# Patient Record
Sex: Female | Born: 1975 | Race: Black or African American | Hispanic: No | Marital: Married | Smoking: Never smoker
Health system: Southern US, Community
[De-identification: ages and names within clinical notes are randomized; demographics above are authoritative.]

---

## 2006-08-06 ENCOUNTER — Ambulatory Visit: Payer: Self-pay | Admitting: Emergency Medicine

## 2006-10-07 ENCOUNTER — Ambulatory Visit: Payer: Self-pay | Admitting: Internal Medicine

## 2011-03-12 HISTORY — PX: MYOMECTOMY: SHX85

## 2011-10-08 DIAGNOSIS — D219 Benign neoplasm of connective and other soft tissue, unspecified: Secondary | ICD-10-CM | POA: Insufficient documentation

## 2014-03-24 DIAGNOSIS — IMO0002 Reserved for concepts with insufficient information to code with codable children: Secondary | ICD-10-CM | POA: Insufficient documentation

## 2014-03-24 DIAGNOSIS — O09529 Supervision of elderly multigravida, unspecified trimester: Secondary | ICD-10-CM | POA: Insufficient documentation

## 2014-03-24 DIAGNOSIS — O30049 Twin pregnancy, dichorionic/diamniotic, unspecified trimester: Secondary | ICD-10-CM | POA: Insufficient documentation

## 2014-03-30 DIAGNOSIS — O280 Abnormal hematological finding on antenatal screening of mother: Secondary | ICD-10-CM | POA: Insufficient documentation

## 2014-09-19 DIAGNOSIS — Z98891 History of uterine scar from previous surgery: Secondary | ICD-10-CM | POA: Insufficient documentation

## 2014-09-22 DIAGNOSIS — Z3A39 39 weeks gestation of pregnancy: Secondary | ICD-10-CM | POA: Insufficient documentation

## 2015-11-08 ENCOUNTER — Encounter: Payer: Self-pay | Admitting: Emergency Medicine

## 2015-11-08 ENCOUNTER — Emergency Department: Payer: BC Managed Care – PPO

## 2015-11-08 ENCOUNTER — Inpatient Hospital Stay
Admission: EM | Admit: 2015-11-08 | Discharge: 2015-11-15 | DRG: 201 | Disposition: A | Discharge status: Home / Self Care | Payer: BC Managed Care – PPO | Attending: Surgery | Admitting: Surgery

## 2015-11-08 DIAGNOSIS — J9383 Other pneumothorax: Secondary | ICD-10-CM | POA: Diagnosis not present

## 2015-11-08 DIAGNOSIS — J939 Pneumothorax, unspecified: Secondary | ICD-10-CM

## 2015-11-08 DIAGNOSIS — Z09 Encounter for follow-up examination after completed treatment for conditions other than malignant neoplasm: Secondary | ICD-10-CM

## 2015-11-08 DIAGNOSIS — R079 Chest pain, unspecified: Secondary | ICD-10-CM

## 2015-11-08 DIAGNOSIS — J9382 Other air leak: Secondary | ICD-10-CM | POA: Diagnosis present

## 2015-11-08 DIAGNOSIS — Z9689 Presence of other specified functional implants: Secondary | ICD-10-CM

## 2015-11-08 LAB — CBC WITH DIFFERENTIAL/PLATELET
BASOS ABS: 0.1 10*3/uL (ref 0–0.1)
BASOS PCT: 1 %
EOS ABS: 0.2 10*3/uL (ref 0–0.7)
Eosinophils Relative: 3 %
HEMATOCRIT: 42 % (ref 35.0–47.0)
HEMOGLOBIN: 14.1 g/dL (ref 12.0–16.0)
Lymphocytes Relative: 49 %
Lymphs Abs: 2.9 10*3/uL (ref 1.0–3.6)
MCH: 28.9 pg (ref 26.0–34.0)
MCHC: 33.5 g/dL (ref 32.0–36.0)
MCV: 86.1 fL (ref 80.0–100.0)
Monocytes Absolute: 0.4 10*3/uL (ref 0.2–0.9)
Monocytes Relative: 7 %
NEUTROS ABS: 2.3 10*3/uL (ref 1.4–6.5)
NEUTROS PCT: 40 %
Platelets: 198 10*3/uL (ref 150–440)
RBC: 4.88 MIL/uL (ref 3.80–5.20)
RDW: 13.1 % (ref 11.5–14.5)
WBC: 5.9 10*3/uL (ref 3.6–11.0)

## 2015-11-08 LAB — BASIC METABOLIC PANEL
ANION GAP: 7 (ref 5–15)
BUN: 19 mg/dL (ref 6–20)
CALCIUM: 9.5 mg/dL (ref 8.9–10.3)
CHLORIDE: 102 mmol/L (ref 101–111)
CO2: 28 mmol/L (ref 22–32)
CREATININE: 0.75 mg/dL (ref 0.44–1.00)
GFR calc non Af Amer: >60 mL/min (ref >60)
Glucose, Bld: 83 mg/dL (ref 65–99)
Potassium: 4 mmol/L (ref 3.5–5.1)
SODIUM: 137 mmol/L (ref 135–145)

## 2015-11-08 MED ORDER — HEPARIN SODIUM (PORCINE) 5000 UNIT/ML IJ SOLN
5000.0000 [IU] | Freq: Three times a day (TID) | INTRAMUSCULAR | Status: DC
  Administered 2015-11-08 – 2015-11-15 (×20): 5000 [IU] via SUBCUTANEOUS
  Filled 2015-11-08 (×19): qty 1

## 2015-11-08 MED ORDER — SODIUM CHLORIDE 0.9% FLUSH: 3.0000 mL | INTRAVENOUS | Status: DC | PRN

## 2015-11-08 MED ORDER — MORPHINE SULFATE (PF) 2 MG/ML IV SOLN
2.0000 mg | INTRAVENOUS | Status: DC | PRN
  Administered 2015-11-08 – 2015-11-14 (×9): 2 mg via INTRAVENOUS
  Filled 2015-11-08 (×9): qty 1

## 2015-11-08 MED ORDER — LIDOCAINE-EPINEPHRINE (PF) 1 %-1:200000 IJ SOLN
30.0000 mL | Freq: Once | INTRAMUSCULAR | Status: AC
  Administered 2015-11-08: 30 mL via INTRADERMAL
  Filled 2015-11-08: qty 30

## 2015-11-08 MED ORDER — MORPHINE SULFATE (PF) 2 MG/ML IV SOLN
INTRAVENOUS | Status: AC
  Administered 2015-11-08: 2 mg via INTRAVENOUS
  Filled 2015-11-08: qty 1

## 2015-11-08 MED ORDER — ONDANSETRON HCL 4 MG PO TABS
4.0000 mg | ORAL_TABLET | Freq: Four times a day (QID) | ORAL | Status: DC | PRN
Start: 2015-11-08 — End: 2015-11-15
  Administered 2015-11-09: 4 mg via ORAL
  Filled 2015-11-08: qty 1

## 2015-11-08 MED ORDER — MORPHINE SULFATE (PF) 2 MG/ML IV SOLN
2.0000 mg | Freq: Once | INTRAVENOUS | Status: AC
  Administered 2015-11-08: 2 mg via INTRAVENOUS

## 2015-11-08 MED ORDER — ONDANSETRON HCL 4 MG/2ML IJ SOLN
4.0000 mg | Freq: Four times a day (QID) | INTRAMUSCULAR | Status: DC | PRN
Start: 2015-11-08 — End: 2015-11-15
  Administered 2015-11-08 – 2015-11-09 (×3): 4 mg via INTRAVENOUS
  Filled 2015-11-08 (×4): qty 2

## 2015-11-08 MED ORDER — LIDOCAINE HCL (PF) 1 % IJ SOLN
INTRAMUSCULAR | Status: AC
  Filled 2015-11-08: qty 25

## 2015-11-08 MED ORDER — LIDOCAINE-EPINEPHRINE (PF) 1 %-1:200000 IJ SOLN
30.0000 mL | Freq: Once | INTRAMUSCULAR | Status: AC
  Administered 2015-11-08: 30 mL via INTRADERMAL

## 2015-11-08 MED ORDER — LIDOCAINE-EPINEPHRINE (PF) 1 %-1:200000 IJ SOLN
INTRAMUSCULAR | Status: AC
  Administered 2015-11-08: 30 mL via INTRADERMAL
  Filled 2015-11-08: qty 30

## 2015-11-08 MED ORDER — SODIUM CHLORIDE 0.9% FLUSH
3.0000 mL | Freq: Two times a day (BID) | INTRAVENOUS | Status: DC
  Administered 2015-11-08 – 2015-11-15 (×14): 3 mL via INTRAVENOUS

## 2015-11-08 MED ORDER — LIDOCAINE HCL (PF) 1 % IJ SOLN
30.0000 mL | Freq: Once | INTRAMUSCULAR | Status: DC
  Filled 2015-11-08: qty 30

## 2015-11-08 MED ORDER — MORPHINE SULFATE (PF) 2 MG/ML IV SOLN
2.0000 mg | Freq: Once | INTRAVENOUS | Status: AC
Start: 2015-11-08 — End: 2015-11-08
  Administered 2015-11-08: 2 mg via INTRAVENOUS
  Filled 2015-11-08: qty 1

## 2015-11-08 MED ORDER — HYDROCODONE-ACETAMINOPHEN 5-325 MG PO TABS
1.0000 | ORAL_TABLET | ORAL | Status: DC | PRN
  Administered 2015-11-09 (×5): 2 via ORAL
  Administered 2015-11-10 (×2): 1 via ORAL
  Administered 2015-11-11: 2 via ORAL
  Administered 2015-11-11: 1 via ORAL
  Administered 2015-11-12 (×2): 2 via ORAL
  Administered 2015-11-12: 1 via ORAL
  Administered 2015-11-12 – 2015-11-14 (×5): 2 via ORAL
  Administered 2015-11-14: 1 via ORAL
  Administered 2015-11-14 – 2015-11-15 (×5): 2 via ORAL
  Filled 2015-11-08: qty 1
  Filled 2015-11-08 (×5): qty 2
  Filled 2015-11-08: qty 1
  Filled 2015-11-08: qty 2
  Filled 2015-11-08: qty 1
  Filled 2015-11-08 (×3): qty 2
  Filled 2015-11-08: qty 1
  Filled 2015-11-08 (×7): qty 2
  Filled 2015-11-08 (×2): qty 1
  Filled 2015-11-08 (×3): qty 2

## 2015-11-08 MED ORDER — SODIUM CHLORIDE 0.9 % IV SOLN: 250.0000 mL | INTRAVENOUS | Status: DC | PRN

## 2015-11-08 NOTE — Op Note (Signed)
   8:53 PM  PATIENT:  Laurie Riggs  40 y.o. female  PRE-OPERATIVE DIAGNOSIS:  Right near-complete pneumothorax  POST-OPERATIVE DIAGNOSIS:  Same  PROCEDURE: Right chest tube placement  SURGEON:  Florene Glen MD, FACS   ANESTHESIA:   local   Details of Procedure: Patient with a near complete right-sided pneumothorax requiring urgent reexpansion via chest tube. Preoperatively discussed rationale for surgery the options of observation risk bleeding infection failure to resolve her Coumadin near-complete pneumothorax and the need for further surgery she and her family understood and agreed to proceed.  Patient was identified and a surgical pause was performed she was placed in the left lateral position prepped and draped in a sterile fashion local anesthetic was infiltrated into the skin and subcutaneous tissues tissues for a total of 60 cc of 1% lidocaine with epinephrine. On the anterior axillary line at approximately the fifth interspace a hypodermic needle was placed into the chest cavity and air was expelled signifying the presence of a right pneumothorax. An incision was made and blunt dissection using a clamp down to the rib was performed and then the 20 French trocar tip catheter was placed into the chest cavity without difficulty immediate expulsion of air was noted on coughing. The chest tube was tied in with multiple 0 silk sutures and connected to Pleur-evac with -20 cm of suction.  A portable chest film has been ordered. Patient tolerated the procedure well she was left in stable condition in the emergency room to be admitted to the hospital.   Florene Glen, MD FACS

## 2015-11-08 NOTE — ED Provider Notes (Signed)
[facility] Emergency Department Provider Note   ____________________________________________   I have reviewed the triage vital signs and the nursing notes.   HISTORY  Chief Complaint Shortness of breath, chest pain   History limited by: Not Limited   HPI Laurie Riggs is a 40 y.o. female who presents to the emergency department today from [facility] because of x-ray concerning for pneumothorax. Patient states that she had an episode of chest pain late last week. It was located on the right side.It occurred while the patient was getting ready to leave her house. She has not had any trauma to her chest wall. The patient then started developing shortness of breath two days ago. It has been getting worse and has been constant. She denies similar symptoms in the past. Went to [facility] where an x-ray was performed and showed a large right sided pneumothorax.    History reviewed. No pertinent past medical history.  There are no active problems to display for this patient.   History reviewed. No pertinent surgical history.  Prior to Admission medications   Not on File    Allergies Review of patient's allergies indicates not on file.  No family history on file.  Social History Social History  Substance Use Topics  . Smoking status: Never Smoker  . Smokeless tobacco: Never Used  . Alcohol use Yes     Comment: Social drinker. Denies daily use.    Review of Systems  Constitutional: Negative for fever. Cardiovascular: Positive for chest pain. Respiratory: Positive for shortness of breath. Gastrointestinal: Negative for abdominal pain, vomiting and diarrhea. Neurological: Negative for headaches, focal weakness or numbness.  10-point ROS otherwise negative.  ____________________________________________   PHYSICAL EXAM:  VITAL SIGNS: ED Triage Vitals  Enc Vitals Group     BP 125/83     Pulse 89     Resp 18     Temp 97.9      Temp src      SpO2 97%   Constitutional: Alert and oriented. Well appearing and in no distress. Eyes: Conjunctivae are normal. Normal extraocular movements. ENT   Head: Normocephalic and atraumatic.   Nose: No congestion/rhinnorhea.   Mouth/Throat: Mucous membranes are moist.   Neck: No stridor. Hematological/Lymphatic/Immunilogical: No cervical lymphadenopathy. Cardiovascular: Normal rate, regular rhythm.  No murmurs, rubs, or gallops. Respiratory: Normal respiratory effort without tachypnea nor retractions. Absent breath sounds on the right side.  Gastrointestinal: Soft and nontender. No distention.  Genitourinary: Deferred Musculoskeletal: Normal range of motion in all extremities. No lower extremity edema. Neurologic:  Normal speech and language. No gross focal neurologic deficits are appreciated.  Skin:  Skin is warm, dry and intact. No rash noted. Psychiatric: Mood and affect are normal. Speech and behavior are normal. Patient exhibits appropriate insight and judgment.  ____________________________________________    LABS (pertinent positives/negatives)  Labs Reviewed  CBC WITH DIFFERENTIAL/PLATELET  BASIC METABOLIC PANEL     ____________________________________________   EKG  None  ____________________________________________    RADIOLOGY  CXR IMPRESSION:  Very large right pneumothorax. Early tension pneumothorax is  suspected.   I, [name], personally discussed these images and results by phone with the on-call radiologist and used this discussion as part of my medical decision making.    ___________________________________________   PROCEDURES  Procedures  ____________________________________________   INITIAL IMPRESSION / ASSESSMENT AND PLAN / ED COURSE  Pertinent labs & imaging results that were available during my care of the patient were reviewed by me and considered in  my medical decision making (see chart for  details).  Patient presented from [facility] clinic because of SOB and CP and found to have large right sided pneumothorax. X-ray and exam consistent with pneumothorax. Surgery was consulted and will place chest tube and admit.  ____________________________________________   FINAL CLINICAL IMPRESSION(S) / ED DIAGNOSES  Final diagnoses:  Pneumothorax     Note: This dictation was prepared with Dragon dictation. Any transcriptional errors that result from this process are unintentional    [name], MD 11/08/15 [phone]

## 2015-11-08 NOTE — ED Notes (Signed)
Dr. Burt Knack at bedside preparing for right chest tube placement.

## 2015-11-08 NOTE — ED Triage Notes (Signed)
Patient to ED via POV with c/o SOB and CP that started last Monday. Patient was seen at [facility] and dx with a pneumothorax. Patient denies any pain at this time but does c/o SOB, worse with movement. Patient Is speaking in full sentences. Patient A&O x4.

## 2015-11-08 NOTE — Progress Notes (Signed)
Personal review of chest x-ray following chest tube placement demonstrates fairly good inflation with a small apical pneumothorax. Patient will remain on suction overnight and repeat chest x-ray in the morning.

## 2015-11-08 NOTE — H&P (Signed)
Laurie Riggs is an 40 y.o. female.    Chief Complaint: Right chest pain  HPI: This a patient with a right pneumothorax near complete. She noticed right-sided chest pain with the acute onset on Friday of last week she then started having assuring of shortness of breath on Monday which has worsened and she was seen in the emergency room where the diagnosis of a near complete right-sided pneumothorax was identified. She continues to have shortness of breath but is in no acute distress has asked see the patient for a chest tube. She has no past medical history Past surgical history was that of fibroid ectomy as well as C-section Social history she works in Air traffic controller at [employer] does not smoke or drink and has a 64-monthold baby  History reviewed. No pertinent past medical history.  History reviewed. No pertinent surgical history.  No family history on file. Social History:  reports that she has never smoked. She has never used smokeless tobacco. She reports that she drinks alcohol. Her drug history is not on file.  Allergies: No Known Allergies   (Not in a hospital admission)   Review of Systems  Constitutional: Negative.   HENT: Negative.   Eyes: Negative.   Respiratory: Positive for shortness of breath. Negative for cough, hemoptysis, sputum production and wheezing.   Cardiovascular: Positive for chest pain. Negative for palpitations, orthopnea, claudication and leg swelling.  Gastrointestinal: Negative.   Genitourinary: Negative.   Musculoskeletal: Negative.   Skin: Negative.   Neurological: Negative.   Endo/Heme/Allergies: Negative.   Psychiatric/Behavioral: Negative.      Physical Exam:  BP (!) 149/88   Pulse 99   Temp 97.9 F (36.6 C) (Oral)   Resp 19   Ht 5' 6"  (1.676 m)   Wt 200 lb (90.7 kg)   LMP 10/31/2015 (Exact Date)   SpO2 97%   BMI 32.28 kg/m   Physical Exam  Constitutional: She is oriented to person, place, and time and  well-developed, well-nourished, and in no distress. No distress.  HENT:  Head: Normocephalic and atraumatic.  Eyes: Right eye exhibits no discharge. Left eye exhibits no discharge. No scleral icterus.  Neck: Normal range of motion.  Cardiovascular: Normal rate, regular rhythm and normal heart sounds.   Pulmonary/Chest: Effort normal. No respiratory distress. She has no wheezes. She has no rales.  Absent breath sounds on the right normal breath sounds on the left, poor right-sided chest wall movement  Abdominal: Soft. She exhibits no distension. There is no tenderness.  Musculoskeletal: Normal range of motion. She exhibits no edema or tenderness.  Lymphadenopathy:    She has no cervical adenopathy.  Neurological: She is alert and oriented to person, place, and time.  Skin: Skin is warm and dry. She is not diaphoretic.  Psychiatric: Mood and affect normal.  Vitals reviewed.       Results for orders placed or performed during the hospital encounter of 11/08/15 (from the past 48 hour(s))  CBC with Differential     Status: None   Collection Time: 11/08/15  7:21 PM  Result Value Ref Range   WBC 5.9 3.6 - 11.0 K/uL   RBC 4.88 3.80 - 5.20 MIL/uL   Hemoglobin 14.1 12.0 - 16.0 g/dL   HCT 42.0 35.0 - 47.0 %   MCV 86.1 80.0 - 100.0 fL   MCH 28.9 26.0 - 34.0 pg   MCHC 33.5 32.0 - 36.0 g/dL   RDW 13.1 11.5 - 14.5 %   Platelets  198 150 - 440 K/uL   Neutrophils Relative % 40 %   Neutro Abs 2.3 1.4 - 6.5 K/uL   Lymphocytes Relative 49 %   Lymphs Abs 2.9 1.0 - 3.6 K/uL   Monocytes Relative 7 %   Monocytes Absolute 0.4 0.2 - 0.9 K/uL   Eosinophils Relative 3 %   Eosinophils Absolute 0.2 0 - 0.7 K/uL   Basophils Relative 1 %   Basophils Absolute 0.1 0 - 0.1 K/uL  Basic metabolic panel     Status: None   Collection Time: 11/08/15  7:21 PM  Result Value Ref Range   Sodium 137 135 - 145 mmol/L   Potassium 4.0 3.5 - 5.1 mmol/L   Chloride 102 101 - 111 mmol/L   CO2 28 22 - 32 mmol/L    Glucose, Bld 83 65 - 99 mg/dL   BUN 19 6 - 20 mg/dL   Creatinine, Ser 0.75 0.44 - 1.00 mg/dL   Calcium 9.5 8.9 - 10.3 mg/dL   GFR calc non Af Amer >60 >60 mL/min   GFR calc Af Amer >60 >60 mL/min    Comment: (NOTE) The eGFR has been calculated using the CKD EPI equation. This calculation has not been validated in all clinical situations. eGFR's persistently <60 mL/min signify possible Chronic Kidney Disease.    Anion gap 7 5 - 15   Dg Chest 1 View  Result Date: 11/08/2015 CLINICAL DATA:  Short of breath and chest pain EXAM: CHEST 1 VIEW COMPARISON:  None. FINDINGS: Large right pneumothorax is present. Estimated percentage is 90%. Heart is normal in size. There is slight shift of the mediastinum to the left compatible with early tension pneumothorax. Vascular congestion within the left lung is noted. IMPRESSION: Very large right pneumothorax. Early tension pneumothorax is suspected. Critical Value/emergent results were called by telephone at the time of interpretation on 11/08/2015 at 7:09 pm to Dr. [name] , who verbally acknowledged these results. Electronically Signed   By: Marybelle Killings M.D.   On: 11/08/2015 19:09     Assessment/Plan  This patient with right-sided complete pneumothorax spontaneous in nature happened 5 days ago.  She requires a chest tube placement urgently and this was discussed with she and her family as well as her emergency room physician and nursing staff.  I discussed with her the rationale for surgery the options of observation risk bleeding infection recurrence inability to completely reexpand the lung either due to a persistent air leak or due to the fact that this is been down for 5 days. This may require surgery in the form of thoracoscopy or open procedure. I also discussed the Dr. Faith Rogue would be able to see the patient in the next day or 2 for thoracic surgery consultation.  She and her family understood and agreed with this plan. See separate  dictation for chest tube placement she will be admitted to the hospital.  Florene Glen, MD, FACS

## 2015-11-08 NOTE — ED Notes (Signed)
Right tube inserted by Dr. Burt Knack. Patient tolerated well.

## 2015-11-09 ENCOUNTER — Observation Stay: Payer: BC Managed Care – PPO

## 2015-11-09 DIAGNOSIS — J9383 Other pneumothorax: Secondary | ICD-10-CM | POA: Diagnosis present

## 2015-11-09 DIAGNOSIS — J939 Pneumothorax, unspecified: Secondary | ICD-10-CM | POA: Diagnosis present

## 2015-11-09 DIAGNOSIS — J9382 Other air leak: Secondary | ICD-10-CM | POA: Diagnosis present

## 2015-11-09 DIAGNOSIS — J9311 Primary spontaneous pneumothorax: Secondary | ICD-10-CM | POA: Diagnosis not present

## 2015-11-09 NOTE — Progress Notes (Signed)
[name] Post-Op Note  Patient ID: Laurie Riggs, female   DOB: [birth year], 40 y.o.   MRN: [id number]  HISTORY: This patient is a 40 year old African-American female who was in her usual state of health until for 5 days prior to admission. At that time she experienced the acute onset of sharp right-sided chest pain followed by some shortness of breath a couple of hours later. The pain got somewhat better over the ensuing days but her shortness of breath got worse and she ultimately presented here to our emergency department. A chest x-ray was obtained in the emergency department and a subsequent chest tube was placed with prompt reexpansion of the lung. The patient states that she felt much better after the tube was placed and immediately felt relief of her shortness of breath. She currently is resting comfortably in bed. There is no family history of a pneumothorax. The patient has denied any prior lung disease or any prior pneumothorax. She was not experiencing any pulmonary symptoms prior to this episode.   Vitals:   11/08/15 2319 11/09/15 0521  BP: 126/78 122/64  Pulse: 98 80  Resp: 18   Temp: 97.6 F (36.4 C) 98.2 F (36.8 C)     EXAM:  Resp: Lungs are clear bilaterally.  No respiratory distress, normal effort.  There is a small air leak present. Heart:  Regular without murmurs.   Abd:  Abdomen is soft, non distended and non tender. No masses are palpable.  There is no rebound and no guarding.  Neurological: Alert and oriented to person, place, and time. Coordination normal.  Skin: Skin is warm and dry. No rash noted. No diaphoretic. No erythema. No pallor.  Psychiatric: Normal mood and affect. Normal behavior. Judgment and thought content normal.    ASSESSMENT: I have independently reviewed the patient's chest x-rays. There has been prompt reexpansion of the lung. The most recent chest x-ray from today shows the lung to be completely inflated.   PLAN:   We will  leave the patient on suction today. We will reassess tomorrow with another chest x-ray. I spent a long time reviewing with her the indications and risks of the various management options for a first-time spontaneous pneumothorax.    Nestor Lewandowsky, MD

## 2015-11-10 MED ORDER — SENNOSIDES-DOCUSATE SODIUM 8.6-50 MG PO TABS
1.0000 | ORAL_TABLET | Freq: Two times a day (BID) | ORAL | Status: DC
  Administered 2015-11-10 – 2015-11-15 (×11): 1 via ORAL
  Filled 2015-11-10 (×11): qty 1

## 2015-11-10 NOTE — Progress Notes (Signed)
Patient is alert and oriented x 4, ambulatory around unit, c/o moderate pain improved with hydrocodone, reports constipation, started on senekot, on room air, denies shortness of breath, minimal output from chest tube, chest xray ordered for the am, vital signs stable.

## 2015-11-10 NOTE — Progress Notes (Signed)
Patient may ambulate with chest tube to water seal per Dr. Genevive Bi, stool softer for constipation, telephone order.

## 2015-11-11 ENCOUNTER — Inpatient Hospital Stay: Payer: BC Managed Care – PPO

## 2015-11-11 DIAGNOSIS — J9311 Primary spontaneous pneumothorax: Secondary | ICD-10-CM

## 2015-11-11 LAB — CBC
HCT: 39.9 % (ref 35.0–47.0)
Hemoglobin: 13.4 g/dL (ref 12.0–16.0)
MCH: 29 pg (ref 26.0–34.0)
MCHC: 33.4 g/dL (ref 32.0–36.0)
MCV: 86.7 fL (ref 80.0–100.0)
PLATELETS: 182 10*3/uL (ref 150–440)
RBC: 4.61 MIL/uL (ref 3.80–5.20)
RDW: 12.8 % (ref 11.5–14.5)
WBC: 5.2 10*3/uL (ref 3.6–11.0)

## 2015-11-11 NOTE — Progress Notes (Signed)
Patient ID: Laurie Riggs, female   DOB: 10-10-1975, 40 y.o.   MRN: [id number] Nursing called stating pt is having some pain in right chest going up to her shoulder. VSS. Ox sat at 100% and stable. CXR shows stable tiny apical; pneumo. No acute changes,.discussed with nursing. Pain med and observation.

## 2015-11-11 NOTE — Progress Notes (Signed)
No complaints today.  Feels well.  Had a CXRay this morning showing 10 % pneumothorax.  I increased the suction to 40 cm of water and repeat CXRay independently reviewed by me shows a trace pneumothorax much improved.  Still has a small air leak.  Lungs clear. Heart regular Abdomen is soft and nontender Wounds redressed.  Clean and dry.  Will leave on suction today Will repeat CXRay tomorrow.  [employer].

## 2015-11-12 ENCOUNTER — Inpatient Hospital Stay: Payer: BC Managed Care – PPO

## 2015-11-12 MED ORDER — IOPAMIDOL (ISOVUE-300) INJECTION 61%
75.0000 mL | Freq: Once | INTRAVENOUS | Status: AC | PRN
  Administered 2015-11-12: 75 mL via INTRAVENOUS

## 2015-11-12 NOTE — Progress Notes (Signed)
[facility] Post-Op Note  Patient ID: Laurie Riggs, female   DOB: Nov 13, 1975, 40 y.o.   MRN: [id number]  HISTORY: She had an episode of chest discomfort which seemed more severe than before last evening.  A CXRay was unchanged.  She continues to have an air leak today.  Pain is intermittent and improving.  Not short of breath.     Vitals:   11/12/15 0639 11/12/15 1300  BP: 119/64 120/71  Pulse: 72 75  Resp: 16 17  Temp: 97.6 F (36.4 C) 97.9 F (36.6 C)     EXAM:    Resp: Lungs are clear bilaterally.  No respiratory distress, normal effort. Heart:  Regular without murmurs Abd:  Abdomen is soft, non distended and non tender. No masses are palpable.  There is no rebound and no guarding.  Neurological: Alert and oriented to person, place, and time. Coordination normal.  Skin: Skin is warm and dry. No rash noted. No diaphoretic. No erythema. No pallor.  Psychiatric: Normal mood and affect. Normal behavior. Judgment and thought content normal.   Small air leak seen  ASSESSMENT: Due to the persistent air leak and her pain, we did obtain a chest CT which did not show any blebs   PLAN:   I will decrease the suction to 20 as there is just a very small pneumothorax on the chest ct Will repeat CXRay in the morning    Nestor Lewandowsky, MD

## 2015-11-13 ENCOUNTER — Inpatient Hospital Stay: Payer: BC Managed Care – PPO

## 2015-11-13 MED ORDER — LIDOCAINE-EPINEPHRINE (PF) 1 %-1:200000 IJ SOLN
30.0000 mL | Freq: Once | INTRAMUSCULAR | Status: AC
  Administered 2015-11-13: 30 mL via INTRADERMAL
  Filled 2015-11-13: qty 30

## 2015-11-13 NOTE — Progress Notes (Addendum)
Notiied Dr. Adonis Huguenin that patinet called nurse to tell her that her chest tube had fell out. Upon nurse entering room chest tube was laying on the floor. Pt was not having any pain or difficulty breathing. Dressing with petroleum gauze, gauze and tegaderm was still intact. RN peeled back tegaderm from one side of the dressing, so that only three sides were attached.

## 2015-11-13 NOTE — Procedures (Signed)
Chest Tube Insertion Procedure Note  Indications:  Clinically significant Pneumothorax  Pre-operative Diagnosis: Pneumothorax 8 Post-operative Diagnosis: Pneumothorax  Procedure Details  Informed consent was obtained for the procedure, including sedation.  Risks of lung perforation, hemorrhage, arrhythmia, and adverse drug reaction were discussed.   After sterile skin prep, using standard technique, a 24 French tube was placed in the right lateral 8 rib space.  Findings: Immediate Rush of Air  Estimated Blood Loss:  Minimal         Specimens:  None              Complications:  None; patient tolerated the procedure well.         Disposition: Remain as inpatient         Condition: stable  Attending Attestation: I performed the procedure.

## 2015-11-13 NOTE — Care Management Note (Addendum)
Case Management Note  Patient Details  Name: Laurie Riggs MRN: [id number] Date of Birth: Apr 20, 1975  Subjective/Objective:            40yo employed and insured  Laurie Riggs became short of breath and was brought to the ED and diagnosed with Riggs right side pneumothorax. On 8/30 she received placement of Riggs right side chest tube by Dr Burt Knack. As of 11/13/15 her chest tube remains intact to water seal with an air leak. Another CXR planned for tomorrow. Case management is following for any discharge needs. Do not anticipate need for home health.         Action/Plan:   Expected Discharge Date:                  Expected Discharge Plan:     In-House Referral:     Discharge planning Services     Post Acute Care Choice:    Choice offered to:     DME Arranged:    DME Agency:     HH Arranged:    HH Agency:     Status of Service:     If discussed at [employer] of Stay Meetings, dates discussed:    Additional Comments:  Laurie Music A, RN 11/13/2015, 11:59 AM

## 2015-11-13 NOTE — Progress Notes (Signed)
Per Dr. Adonis Huguenin place order for 1% lidocaine with 1:200,000 epinephrine.

## 2015-11-13 NOTE — Progress Notes (Signed)
[facility] Post-Op [facility]  Patient ID: Preksha Tempel, female   DOB: 1975/09/11, 40 y.o.   MRN: [id number]  HISTORY: Had more discomfort yesterday.  Describes intermittent pain like a "cramp".  Feels she needs to have bowel movement.  Taking oral stool softeners.     Vitals:   11/12/15 2056 11/13/15 0432  BP: 116/82 112/61  Pulse: 96 81  Resp: 18   Temp: 98.6 F (37 C) 97.9 F (36.6 C)     EXAM:    Resp: Lungs are clear bilaterally.  No respiratory distress, normal effort. Heart:  Regular without murmurs Abd:  Abdomen is soft, non distended and non tender. No masses are palpable.  There is no rebound and no guarding.  Neurological: Alert and oriented to person, place, and time. Coordination normal.  Skin: Skin is warm and dry. No rash noted. No diaphoretic. No erythema. No pallor.  Psychiatric: Normal mood and affect. Normal behavior. Judgment and thought content normal.   There is a small air leak.    The chest xray from this morning shows a small apical pneumothorax   ASSESSMENT: Spontaneous pneumothorax with "normal" CT scan of chest.     PLAN:   Reviewed the options of "wait and watch" versus VATS with apical blebectomy and mechanical pleurodesis.  Reviewed advantages and disadvantages.  She would like to wait.  Will try to minimize suction on the chest tube (placed to water seal).  Will repeat CXray later today.    Nestor Lewandowsky, MD

## 2015-11-14 ENCOUNTER — Inpatient Hospital Stay: Payer: BC Managed Care – PPO

## 2015-11-14 MED ORDER — BISACODYL 10 MG RE SUPP
10.0000 mg | Freq: Once | RECTAL | Status: AC
  Administered 2015-11-14: 10 mg via RECTAL
  Filled 2015-11-14: qty 1

## 2015-11-14 NOTE — Progress Notes (Signed)
Per MD okay to place order for dulcolax suppository as pt has not had BM since Friday and Sennokot-S has not been helping.

## 2015-11-14 NOTE — Progress Notes (Signed)
CC: Pneumothorax Subjective: Patient without new complaints this morning. No active air leak on chest tube today. Patient's strong desire to be done with this process and to go home as soon as possible.  Objective: Vital signs in last 24 hours: Temp:  [98.1 F (36.7 C)-98.3 F (36.8 C)] 98.2 F (36.8 C) (09/05 0806) Pulse Rate:  [76-84] 84 (09/05 0806) Resp:  [18-20] 18 (09/05 0806) BP: (122-131)/(71-77) 122/71 (09/05 0806) SpO2:  [95 %-100 %] 95 % (09/05 0806) Last BM Date: 11/10/15  Intake/Output from previous day: 09/04 0701 - 09/05 0700 In: 0  Out: 66 [Chest Tube:66] Intake/Output this shift: Total I/O In: 0  Out: 32 [Chest Tube:32]  Physical exam:  Gen.: No acute distress Chest: Right chest appropriate tender to palpation. Chest tube in place without active air leak but good tiling on inspiration. Heart: Regular rate and rhythm Abdomen: Soft and nontender  Lab Results: CBC  No results for input(s): WBC, HGB, HCT, PLT in the last 72 hours. BMET No results for input(s): NA, K, CL, CO2, GLUCOSE, BUN, CREATININE, CALCIUM in the last 72 hours. PT/INR No results for input(s): LABPROT, INR in the last 72 hours. ABG No results for input(s): PHART, HCO3 in the last 72 hours.  Invalid input(s): PCO2, PO2  Studies/Results: Dg Chest 1 View  Result Date: 11/13/2015 Clayburn Pert, MD     11/13/2015  3:46 PM Chest Tube Insertion Procedure Note Indications:  Clinically significant Pneumothorax Pre-operative Diagnosis: Pneumothorax 8 Post-operative Diagnosis: Pneumothorax Procedure Details Informed consent was obtained for the procedure, including sedation.  Risks of lung perforation, hemorrhage, arrhythmia, and adverse drug reaction were discussed. After sterile skin prep, using standard technique, a 24 French tube was placed in the right lateral 8 rib space. Findings: Immediate Rush of Air Estimated Blood Loss:  Minimal        Specimens:  None             Complications:  None;  patient tolerated the procedure well.        Disposition: Remain as inpatient        Condition: stable Attending Attestation: I performed the procedure.  Dg Chest 2 View  Result Date: 11/14/2015 CLINICAL DATA:  Reason chest tube placement EXAM: CHEST  2 VIEW COMPARISON:  Chest CT and chest radiograph November 12, 2015 FINDINGS: Chest tube remains on the right without demonstrable pneumothorax. There is no appreciable edema or consolidation. There is mild atelectasis in the right perihilar region. The heart size and pulmonary vascularity are normal. No adenopathy. No bone lesions. IMPRESSION: No evident pneumothorax with chest tube in place on the right. Mild right perihilar region atelectatic change. Lungs elsewhere clear. Stable cardiac silhouette. Electronically Signed   By: Lowella Grip III M.D.   On: 11/14/2015 07:24   Dg Chest Port 1 View  Result Date: 11/13/2015 CLINICAL DATA:  Spontaneous pneumothorax, followup EXAM: PORTABLE CHEST 1 VIEW COMPARISON:  Portable exam 1350 hours compared to 0704 hours FINDINGS: Tip of RIGHT thoracostomy tube is located within the subcutaneous tissues of the inferior lateral RIGHT chest wall, external to the thoracic space since the previous exam. Large RIGHT pneumothorax significantly increased since previous exam. No mediastinal shift/evidence of tension. Heart size and pulmonary vascularity normal. LEFT lung clear. Minimal RIGHT basilar atelectasis. Bones unremarkable. IMPRESSION: Marked increase in size of RIGHT pneumothorax since earlier study of 11/13/2015. RIGHT thoracostomy tube has been substantially withdrawn, with tip now within the soft tissues of the inferolateral RIGHT chest wall external to  the costal margin. Critical Value/emergent results were called by telephone at the time of interpretation on 11/13/2015 at 2:13 pm to patient's nurse Abby RN, who verbally acknowledged these results and will page the physican with the results. Electronically Signed    By: Lavonia Dana M.D.   On: 11/13/2015 14:15   Dg Chest Port 1 View  Result Date: 11/13/2015 CLINICAL DATA:  Right pneumothorax. EXAM: PORTABLE CHEST 1 VIEW COMPARISON:  Chest x-ray and chest CT studies yesterday. FINDINGS: Right basilar chest tube remains in place. There is a small, roughly 5-10% apical pneumothorax present. No edema, airspace consolidation or pleural fluid. The heart size and mediastinal contours are normal. IMPRESSION: 5-10% right apical pneumothorax present. Electronically Signed   By: Aletta Edouard M.D.   On: 11/13/2015 08:33    Anti-infectives: Anti-infectives    None      Assessment/Plan:  40 year old female with a spontaneous right pneumothorax. Chest tube in place now with no evidence of air leak and no evidence of pneumothorax on imaging today. Plan to return to [facility] today. Repeat chest x-ray in the morning and if no evidence pneumothorax at that time we will remove chest tube. Once free of chest tube with no evidence pneumothorax patient will be ready for discharge.  Adolf Ormiston T. Adonis Huguenin, MD, FACS  11/14/2015

## 2015-11-15 ENCOUNTER — Inpatient Hospital Stay: Payer: BC Managed Care – PPO

## 2015-11-15 LAB — CBC
HEMATOCRIT: 38.2 % (ref 35.0–47.0)
Hemoglobin: 13 g/dL (ref 12.0–16.0)
MCH: 29.3 pg (ref 26.0–34.0)
MCHC: 34 g/dL (ref 32.0–36.0)
MCV: 86.3 fL (ref 80.0–100.0)
PLATELETS: 166 10*3/uL (ref 150–440)
RBC: 4.43 MIL/uL (ref 3.80–5.20)
RDW: 13.2 % (ref 11.5–14.5)
WBC: 6.1 10*3/uL (ref 3.6–11.0)

## 2015-11-15 MED ORDER — HYDROCODONE-ACETAMINOPHEN 5-325 MG PO TABS: 1.0000 | ORAL_TABLET | ORAL | 0 refills | Status: DC | PRN

## 2015-11-15 NOTE — Progress Notes (Signed)
Pt A and O x 4. VSS. Pt tolerating diet well. No complaints of pain or nausea. IV removed intact, prescriptions given. Pt voiced understanding of discharge instructions with no further questions. Pt discharged via wheelchair with axillary. Pt provided with dressing supplies

## 2015-11-15 NOTE — Discharge Instructions (Signed)
Pneumothorax °A pneumothorax, commonly called a collapsed lung, is a condition in which air leaks from a lung and builds up in the space between the lung and the chest wall (pleural space). The air in a pneumothorax is trapped outside the lung and takes up space, preventing the lung from fully expanding. This is a condition that usually occurs suddenly. The buildup of air may be small or large. A small pneumothorax may go away on its own. When a pneumothorax is larger, it will often require medical treatment and hospitalization.  °CAUSES  °A pneumothorax can sometimes happen quickly with no apparent cause. People with underlying lung problems, particularly COPD or emphysema, are at higher risk of pneumothorax. However, pneumothorax can happen quickly even in people with no prior known lung problems. Trauma, surgery, medical procedures, or injury to the chest wall can also cause a pneumothorax. °SIGNS AND SYMPTOMS  °Sometimes a pneumothorax will have no symptoms. When symptoms are present, they can include: °· Chest pain. °· Shortness of breath. °· Increased rate of breathing. °· Bluish color to your lips or skin (cyanosis). °DIAGNOSIS  °Pneumothorax is usually diagnosed by a chest X-ray or chest CT scan. Your health care provider will also take a medical history and perform a physical exam to determine why you may have a pneumothorax. °TREATMENT  °A small pneumothorax may go away on its own without treatment. Extra oxygen can sometimes help a small pneumothorax go away more quickly. For a larger pneumothorax or a pneumothorax that is causing symptoms, a procedure is usually needed to drain the air. In some cases, the health care provider may drain the air using a needle. In other cases, a chest tube may be inserted into the pleural space. A chest tube is a small tube placed between the ribs and into the pleural space. This removes the extra air and allows the lung to expand back to its normal size. A large  pneumothorax will usually require a hospital stay. If there is ongoing air leakage into the pleural space, then the chest tube may need to remain in place for several days until the air leak has healed. In some cases, surgery may be needed.  °HOME CARE INSTRUCTIONS  °· Only take over-the-counter or prescription medicines as directed by your health care provider. °· If a cough or pain makes it difficult for you to sleep at night, try sleeping in a semi-upright position in a recliner or by using 2 or 3 pillows. °· Rest and limit activity as directed by your health care provider. °· If you had a chest tube and it was removed, ask your health care provider when it is okay to remove the dressing. Until your health care provider says you can remove the dressing, do not allow it to get wet. °· Do not smoke. Smoking is a risk factor for pneumothorax. °· Do not fly in an airplane or scuba dive until your health care provider says it is okay. °· Follow up with your health care provider as directed. °SEEK IMMEDIATE MEDICAL CARE IF:  °· You have increasing chest pain or shortness of breath. °· You have a cough that is not controlled with suppressants. °· You begin coughing up blood. °· You have pain that is getting worse or is not controlled with medicines. °· You cough up thick, discolored mucus (sputum) that is yellow to green in color. °· You have redness, increasing pain, or discharge at the site where a chest tube had been in place (if   your pneumothorax was treated with a chest tube). °· The site where your chest tube was located opens up. °· You feel air coming out of the site where the chest tube was placed. °· You have a fever or persistent symptoms for more than 2-3 days. °· You have a fever and your symptoms suddenly get worse. °MAKE SURE YOU:  °· Understand these instructions. °· Will watch your condition. °· Will get help right away if you are not doing well or get worse. °  °This information is not intended to  replace advice given to you by your health care provider. Make sure you discuss any questions you have with your health care provider. °  °Document Released: 02/25/2005 Document Revised: 12/16/2012 Document Reviewed: 09/24/2012 °Elsevier Interactive Patient Education ©2016 [employer] ° °

## 2015-11-15 NOTE — Progress Notes (Signed)
CC: Right-sided pneumothorax Subjective: Patient without acute complaints this morning. Again voice is a strong desire to go home.  Objective: Vital signs in last 24 hours: Temp:  [98.3 F (36.8 C)-98.4 F (36.9 C)] 98.3 F (36.8 C) (09/06 0546) Pulse Rate:  [81-97] 97 (09/06 0546) BP: (120-129)/(66-68) 129/66 (09/06 0546) SpO2:  [100 %] 100 % (09/06 0546) Last BM Date: 11/14/15  Intake/Output from previous day: 09/05 0701 - 09/06 0700 In: 443 [P.O.:440; I.V.:3] Out: 82 [Chest Tube:37] Intake/Output this shift: Total I/O In: 240 [P.O.:240] Out: 0   Physical exam:  Gen.: No acute distress Chest: Clear to auscultation. Chest tube placed at the right chest that titles with inspiration and expiration. No evidence of air leak. Heart: Regular rhythm Abdomen: Soft and nontender  Lab Results: CBC   Recent Labs  11/15/15 0433  WBC 6.1  HGB 13.0  HCT 38.2  PLT 166   BMET No results for input(s): NA, K, CL, CO2, GLUCOSE, BUN, CREATININE, CALCIUM in the last 72 hours. PT/INR No results for input(s): LABPROT, INR in the last 72 hours. ABG No results for input(s): PHART, HCO3 in the last 72 hours.  Invalid input(s): PCO2, PO2  Studies/Results: Dg Chest 1 View  Result Date: 11/13/2015 Clayburn Pert, MD     11/13/2015  3:46 PM Chest Tube Insertion Procedure Note Indications:  Clinically significant Pneumothorax Pre-operative Diagnosis: Pneumothorax 8 Post-operative Diagnosis: Pneumothorax Procedure Details Informed consent was obtained for the procedure, including sedation.  Risks of lung perforation, hemorrhage, arrhythmia, and adverse drug reaction were discussed. After sterile skin prep, using standard technique, a 24 French tube was placed in the right lateral 8 rib space. Findings: Immediate Rush of Air Estimated Blood Loss:  Minimal        Specimens:  None             Complications:  None; patient tolerated the procedure well.        Disposition: Remain as inpatient         Condition: stable Attending Attestation: I performed the procedure.  Dg Chest 2 View  Result Date: 11/15/2015 CLINICAL DATA:  Chest tube placement. EXAM: CHEST  2 VIEW COMPARISON:  All 11/13/2015. FINDINGS: Stable appearance to the RIGHT chest tube. No pneumothorax. Stable cardiomediastinal silhouette. Low lung volumes. Small RIGHT effusion. No infiltrates or edema. No osseous findings. IMPRESSION: RIGHT chest tube good position.  No pneumothorax. Electronically Signed   By: Staci Righter M.D.   On: 11/15/2015 07:54   Dg Chest 2 View  Result Date: 11/14/2015 CLINICAL DATA:  Reason chest tube placement EXAM: CHEST  2 VIEW COMPARISON:  Chest CT and chest radiograph November 12, 2015 FINDINGS: Chest tube remains on the right without demonstrable pneumothorax. There is no appreciable edema or consolidation. There is mild atelectasis in the right perihilar region. The heart size and pulmonary vascularity are normal. No adenopathy. No bone lesions. IMPRESSION: No evident pneumothorax with chest tube in place on the right. Mild right perihilar region atelectatic change. Lungs elsewhere clear. Stable cardiac silhouette. Electronically Signed   By: Lowella Grip III M.D.   On: 11/14/2015 07:24   Dg Chest Port 1 View  Result Date: 11/13/2015 CLINICAL DATA:  Spontaneous pneumothorax, followup EXAM: PORTABLE CHEST 1 VIEW COMPARISON:  Portable exam 1350 hours compared to 0704 hours FINDINGS: Tip of RIGHT thoracostomy tube is located within the subcutaneous tissues of the inferior lateral RIGHT chest wall, external to the thoracic space since the previous exam. Large RIGHT pneumothorax significantly  increased since previous exam. No mediastinal shift/evidence of tension. Heart size and pulmonary vascularity normal. LEFT lung clear. Minimal RIGHT basilar atelectasis. Bones unremarkable. IMPRESSION: Marked increase in size of RIGHT pneumothorax since earlier study of 11/13/2015. RIGHT thoracostomy tube has been  substantially withdrawn, with tip now within the soft tissues of the inferolateral RIGHT chest wall external to the costal margin. Critical Value/emergent results were called by telephone at the time of interpretation on 11/13/2015 at 2:13 pm to patient's nurse Abby RN, who verbally acknowledged these results and will page the physican with the results. Electronically Signed   By: Lavonia Dana M.D.   On: 11/13/2015 14:15    Anti-infectives: Anti-infectives    None      Assessment/Plan:  40 year old female with right-sided spontaneous pneumothorax. Pneumothorax completely resolved on today's x-ray. No evidence of air leak on chest tube. Chest tube removed at the bedside without any difficulty. Discussed with her the need to monitor her for a few hours after removal of the chest tube. So long as she remains asymptomatic she will likely be discharged home this afternoon.  Anisha Starliper T. Adonis Huguenin, MD, FACS  11/15/2015

## 2015-11-15 NOTE — Care Management (Signed)
Reported that patient's chest tube is now to [facility].  MD to evaluate for potential removal.  No anticipated needs at discharge. Patient continues on RA.

## 2015-11-15 NOTE — Discharge Summary (Signed)
Patient ID: Laurie Riggs MRN: [id number] DOB/AGE: [birth year] 40 y.o.  Admit date: 11/08/2015 Discharge date: 11/15/2015  Discharge Diagnoses:  Right pneumothorax  Procedures Performed: Chest tube to right chest  Discharged Condition: good  Hospital Course: Patient admitted with a right sided pneumothorax. First chest tube fell out prior to resolution of pneumothorax. After second tube was placed the air leak stopped and her lung remained up. Able to be discharged home after removal of tube.  Discharge Orders: Discharge home, keep current dressing in place for 48 hours then change daily until healed. No strenuous activity for 2 weeks.  Disposition: HOME  Discharge Medications:   Medication List    TAKE these medications   HYDROcodone-acetaminophen 5-325 MG tablet Commonly known as:  NORCO/VICODIN Take 1-2 tablets by mouth every 4 (four) hours as needed for moderate pain.        Follwup: Follow-up [facility]. Go in 1 week(s).   Specialty:  General Surgery Why:  for follow up chest tube Contact information: [place] [address] [place] [phone]          Signed: Clayburn Pert 11/15/2015, 1:00 PM

## 2015-11-21 ENCOUNTER — Other Ambulatory Visit: Payer: Self-pay

## 2015-11-21 ENCOUNTER — Ambulatory Visit (INDEPENDENT_AMBULATORY_CARE_PROVIDER_SITE_OTHER): Payer: BC Managed Care – PPO | Admitting: Cardiothoracic Surgery

## 2015-11-21 ENCOUNTER — Encounter: Payer: Self-pay | Admitting: Cardiothoracic Surgery

## 2015-11-21 ENCOUNTER — Ambulatory Visit
Admission: RE | Admit: 2015-11-21 | Discharge: 2015-11-21 | Disposition: A | Discharge status: Home / Self Care | Payer: BC Managed Care – PPO | Source: Ambulatory Visit | Attending: Cardiothoracic Surgery | Admitting: Cardiothoracic Surgery

## 2015-11-21 VITALS — BP 129/80 | HR 81 | Temp 97.8°F | Ht 66.0 in | Wt 200.0 lb

## 2015-11-21 DIAGNOSIS — R918 Other nonspecific abnormal finding of lung field: Secondary | ICD-10-CM | POA: Insufficient documentation

## 2015-11-21 DIAGNOSIS — R911 Solitary pulmonary nodule: Secondary | ICD-10-CM

## 2015-11-21 DIAGNOSIS — J9383 Other pneumothorax: Secondary | ICD-10-CM | POA: Diagnosis not present

## 2015-11-21 DIAGNOSIS — Z8709 Personal history of other diseases of the respiratory system: Secondary | ICD-10-CM | POA: Diagnosis not present

## 2015-11-21 NOTE — Progress Notes (Signed)
Laurie Riggs Inpatient Post-Op Note  Patient ID: Laurie Riggs, female   DOB: 08-31-1975, 40 y.o.   MRN: [id number]  HISTORY: No pain or shortness of breath.  Went to work yesterday without any problems     Vitals:   11/21/15 0851  BP: 129/80  Pulse: 81  Temp: 97.8 F (36.6 C)     EXAM:    Resp: Lungs are clear bilaterally.  No respiratory distress, normal effort. Heart:  Regular without murmurs Abd:  Abdomen is soft, non distended and non tender. No masses are palpable.  There is no rebound and no guarding.  Neurological: Alert and oriented to person, place, and time. Coordination normal.  Skin: Skin is warm and dry. No rash noted. No diaphoretic. No erythema. No pallor.  Chest tube site is clean and dry.  Psychiatric: Normal mood and affect. Normal behavior. Judgment and thought content normal.    ASSESSMENT: Independent review of CXRay shows tenting of right hemidiaphragm but no pneumothorax or pleural effusion.     PLAN:   Reviewed the signs and symptoms of recurrent pneumothorax.  She understands and will seek immediate medical attention if symptoms occur.      Nestor Lewandowsky, MD

## 2016-12-27 ENCOUNTER — Other Ambulatory Visit: Payer: Self-pay | Admitting: Obstetrics and Gynecology

## 2016-12-27 DIAGNOSIS — Z1231 Encounter for screening mammogram for malignant neoplasm of breast: Secondary | ICD-10-CM

## 2017-01-02 ENCOUNTER — Ambulatory Visit
Admission: RE | Admit: 2017-01-02 | Discharge: 2017-01-02 | Disposition: A | Discharge status: Home / Self Care | Payer: BC Managed Care – PPO | Source: Ambulatory Visit | Attending: Obstetrics and Gynecology | Admitting: Obstetrics and Gynecology

## 2017-01-02 DIAGNOSIS — Z1231 Encounter for screening mammogram for malignant neoplasm of breast: Secondary | ICD-10-CM | POA: Diagnosis not present

## 2017-01-03 ENCOUNTER — Other Ambulatory Visit: Payer: Self-pay | Admitting: Obstetrics and Gynecology

## 2017-01-03 DIAGNOSIS — R928 Other abnormal and inconclusive findings on diagnostic imaging of breast: Secondary | ICD-10-CM

## 2017-01-03 DIAGNOSIS — N6310 Unspecified lump in the right breast, unspecified quadrant: Secondary | ICD-10-CM

## 2017-01-10 ENCOUNTER — Ambulatory Visit
Admission: RE | Admit: 2017-01-10 | Discharge: 2017-01-10 | Disposition: A | Discharge status: Home / Self Care | Payer: BC Managed Care – PPO | Source: Ambulatory Visit | Attending: Obstetrics and Gynecology | Admitting: Obstetrics and Gynecology

## 2017-01-10 DIAGNOSIS — N6310 Unspecified lump in the right breast, unspecified quadrant: Secondary | ICD-10-CM | POA: Diagnosis present

## 2017-01-10 DIAGNOSIS — N6314 Unspecified lump in the right breast, lower inner quadrant: Secondary | ICD-10-CM | POA: Insufficient documentation

## 2017-01-10 DIAGNOSIS — R928 Other abnormal and inconclusive findings on diagnostic imaging of breast: Secondary | ICD-10-CM

## 2017-01-15 ENCOUNTER — Other Ambulatory Visit: Payer: Self-pay | Admitting: Obstetrics and Gynecology

## 2017-01-15 DIAGNOSIS — N6310 Unspecified lump in the right breast, unspecified quadrant: Secondary | ICD-10-CM

## 2017-01-15 DIAGNOSIS — R928 Other abnormal and inconclusive findings on diagnostic imaging of breast: Secondary | ICD-10-CM

## 2017-01-22 ENCOUNTER — Ambulatory Visit
Admission: RE | Admit: 2017-01-22 | Discharge: 2017-01-22 | Disposition: A | Discharge status: Home / Self Care | Payer: BC Managed Care – PPO | Source: Ambulatory Visit | Attending: Obstetrics and Gynecology | Admitting: Obstetrics and Gynecology

## 2017-01-22 DIAGNOSIS — N6310 Unspecified lump in the right breast, unspecified quadrant: Secondary | ICD-10-CM

## 2017-01-22 DIAGNOSIS — R928 Other abnormal and inconclusive findings on diagnostic imaging of breast: Secondary | ICD-10-CM

## 2017-01-22 DIAGNOSIS — N6314 Unspecified lump in the right breast, lower inner quadrant: Secondary | ICD-10-CM | POA: Insufficient documentation

## 2017-01-22 HISTORY — PX: BREAST BIOPSY: SHX20

## 2017-01-23 LAB — SURGICAL PATHOLOGY

## 2018-09-09 IMAGING — US US BREAST*R* LIMITED INC AXILLA
1 series · 10 of 10 positions shown · non-contrast
Comparison: Previous exam(s).

CLINICAL DATA: Callback from screening mammogram for possible mass
right breast

EXAM:
2D DIGITAL DIAGNOSTIC RIGHT MAMMOGRAM WITH ADJUNCT TOMO
ULTRASOUND RIGHT BREAST

[Series 1: us breast*right* limited inc axilla · 0.06mm/px · 10 of 10 slices shown]
[im 1/10]
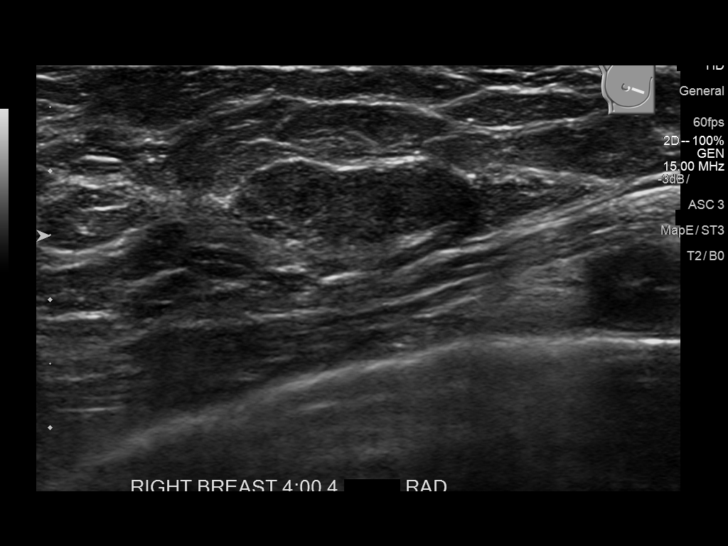
[im 2/10]
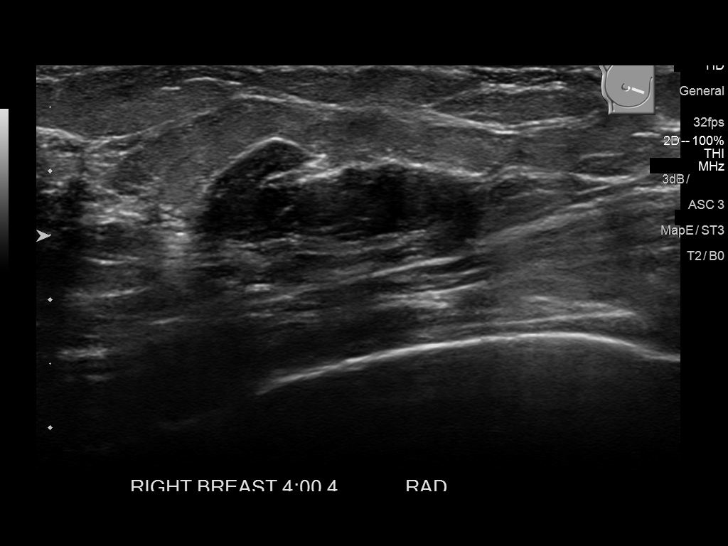
[im 3/10]
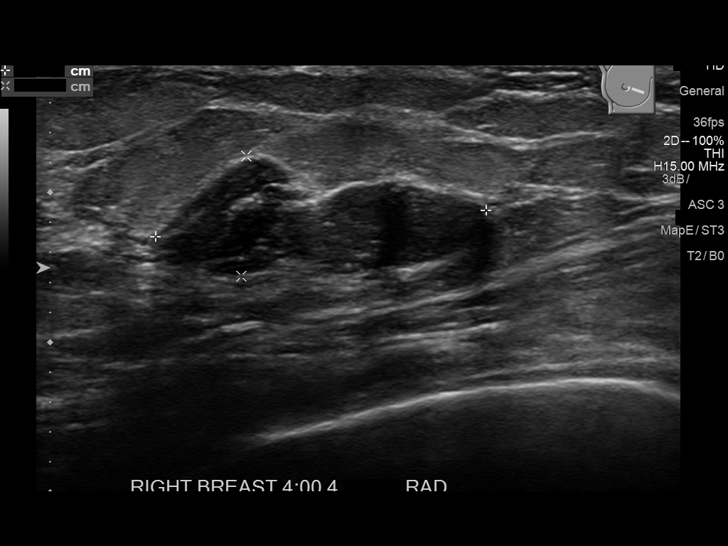
[im 4/10]
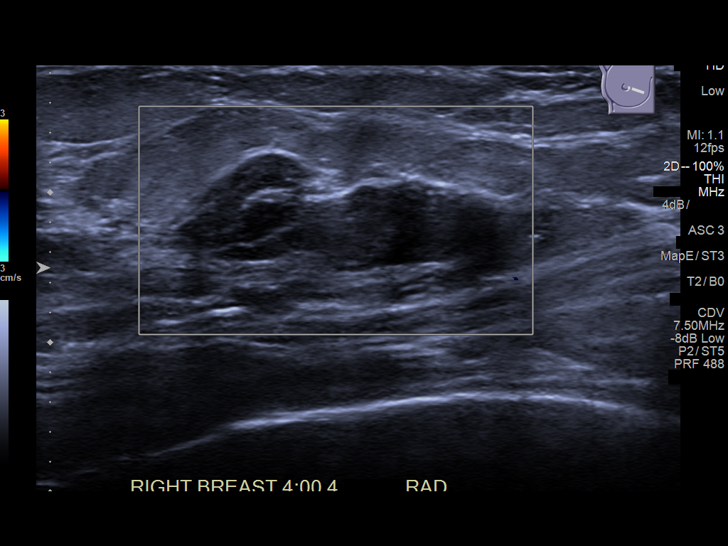
[im 5/10]
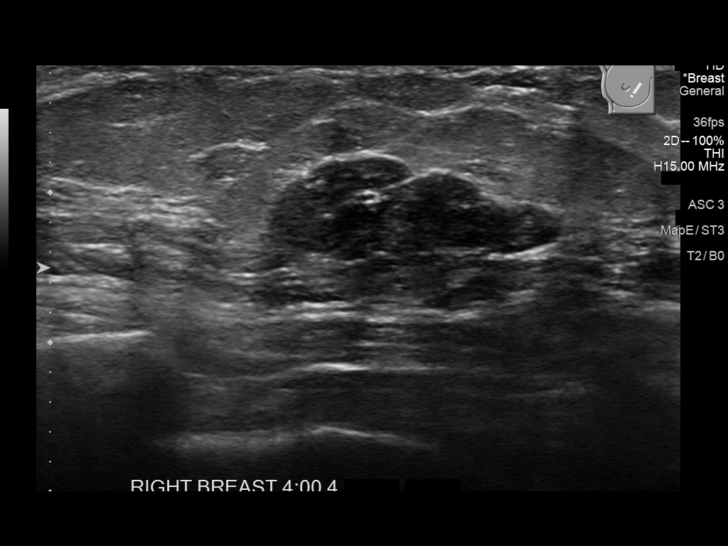
[im 6/10]
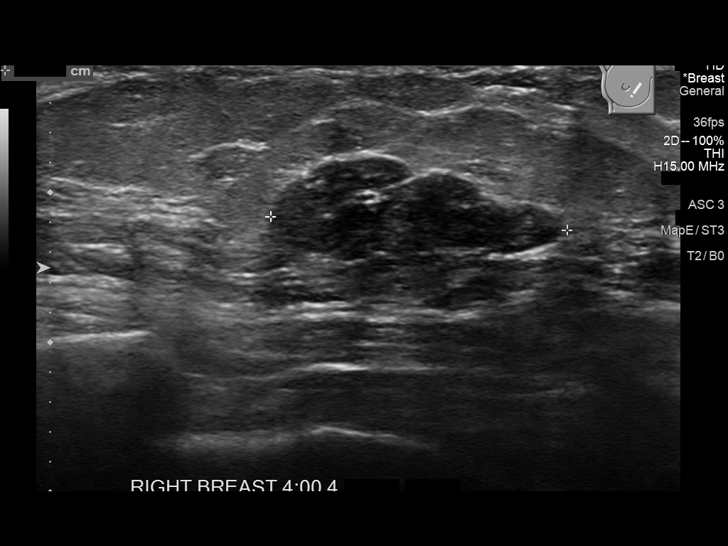
[im 7/10]
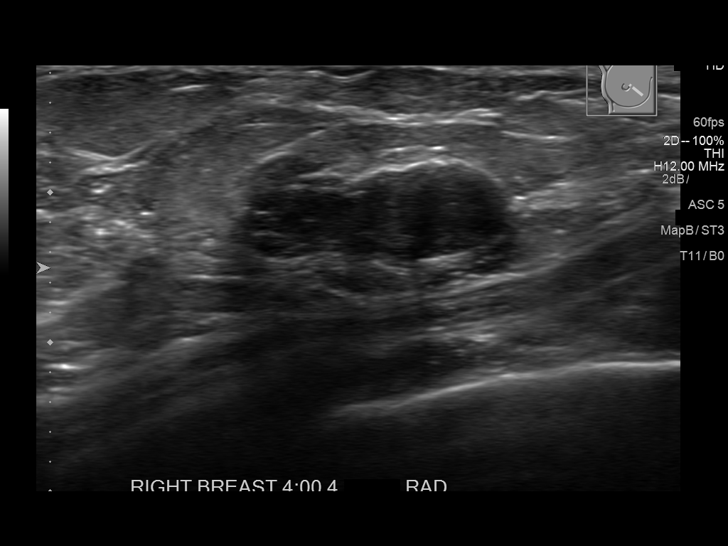
[im 8/10]
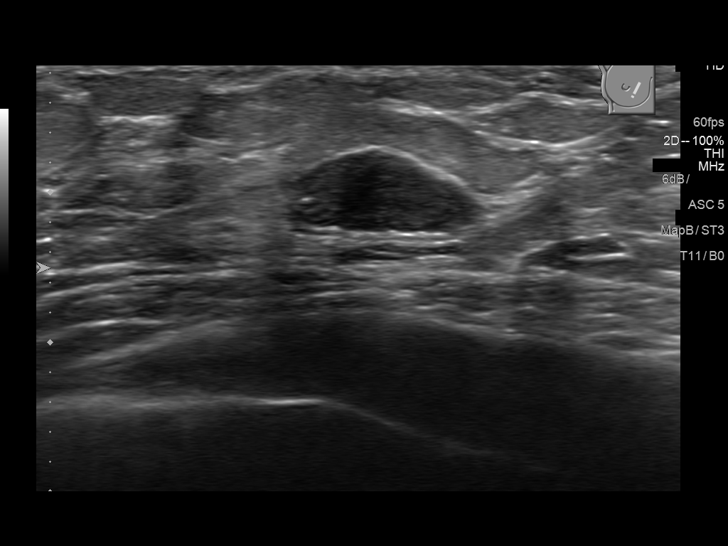
[im 9/10]
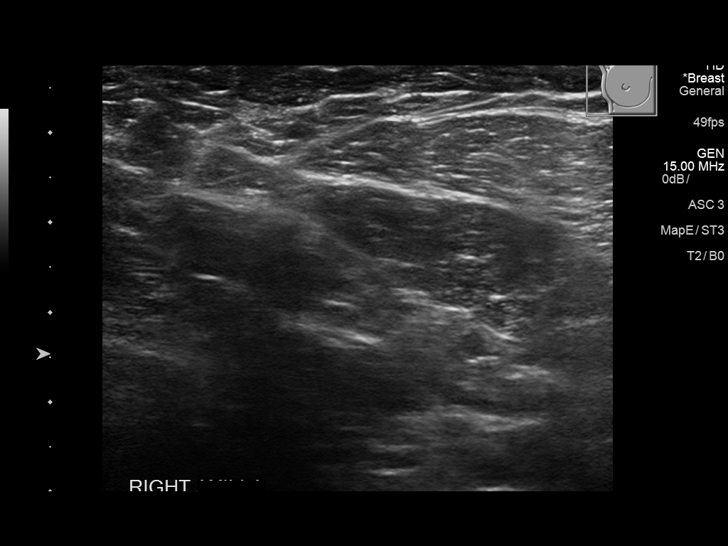
[im 10/10]
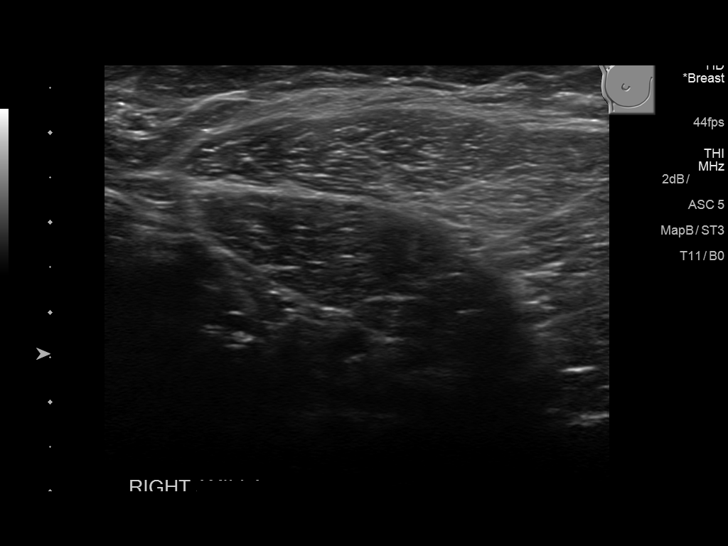

[10 of 10 positions shown; findings below may reference images not displayed]

ACR Breast Density Category c: The breast tissue is heterogeneously
dense, which may obscure small masses.
FINDINGS: Spot compression CC and MLO views of the right breast are submitted.
Previously questioned mass in the lower-inner quadrant right breast
persists on additional views.

Targeted ultrasound is performed, showing slight lobulated
hypoechoic lesion at the right breast 4 o'clock 4 cm from nipple
measuring 2.2 x 0.8 x 2 cm. This correlates to the mammographic
finding. This may be a fibroadenoma.
IMPRESSION: Probable benign findings.

RECOMMENDATION:
I discussed the options of six-month follow-up, needle biopsy for
tissue diagnosis. The patient prefers needle biopsy for tissue
diagnosis.

I have discussed the findings and recommendations with the patient.
Results were also provided in writing at the conclusion of the
visit. If applicable, a reminder letter will be sent to the patient
regarding the next appointment.

BI-RADS CATEGORY  3: Probably benign.
